# Patient Record
Sex: Male | Born: 1935 | Race: White | Hispanic: No | Smoking: Never smoker
Health system: Southern US, Community
[De-identification: ages and names within clinical notes are randomized; demographics above are authoritative.]

---

## 2019-12-30 ENCOUNTER — Encounter (HOSPITAL_COMMUNITY): Payer: Self-pay | Admitting: Emergency Medicine

## 2019-12-30 ENCOUNTER — Emergency Department (HOSPITAL_COMMUNITY)
Admission: EM | Admit: 2019-12-30 | Discharge: 2019-12-30 | Disposition: A | Discharge status: Home / Self Care | Payer: Medicare Other | Attending: Emergency Medicine | Admitting: Emergency Medicine

## 2019-12-30 ENCOUNTER — Other Ambulatory Visit: Payer: Self-pay

## 2019-12-30 ENCOUNTER — Emergency Department (HOSPITAL_COMMUNITY): Payer: Medicare Other

## 2019-12-30 DIAGNOSIS — W231XXA Caught, crushed, jammed, or pinched between stationary objects, initial encounter: Secondary | ICD-10-CM | POA: Diagnosis not present

## 2019-12-30 DIAGNOSIS — Z23 Encounter for immunization: Secondary | ICD-10-CM | POA: Insufficient documentation

## 2019-12-30 DIAGNOSIS — S62632A Displaced fracture of distal phalanx of right middle finger, initial encounter for closed fracture: Secondary | ICD-10-CM | POA: Diagnosis not present

## 2019-12-30 DIAGNOSIS — S61212A Laceration without foreign body of right middle finger without damage to nail, initial encounter: Secondary | ICD-10-CM | POA: Diagnosis not present

## 2019-12-30 DIAGNOSIS — S6991XA Unspecified injury of right wrist, hand and finger(s), initial encounter: Secondary | ICD-10-CM | POA: Diagnosis present

## 2019-12-30 DIAGNOSIS — S62632B Displaced fracture of distal phalanx of right middle finger, initial encounter for open fracture: Secondary | ICD-10-CM

## 2019-12-30 DIAGNOSIS — S61312A Laceration without foreign body of right middle finger with damage to nail, initial encounter: Secondary | ICD-10-CM

## 2019-12-30 DIAGNOSIS — Y92810 Car as the place of occurrence of the external cause: Secondary | ICD-10-CM | POA: Diagnosis not present

## 2019-12-30 MED ORDER — CEPHALEXIN 250 MG PO CAPS
500.0000 mg | ORAL_CAPSULE | Freq: Once | ORAL | Status: AC
  Administered 2019-12-30: 500 mg via ORAL
  Filled 2019-12-30: qty 2

## 2019-12-30 MED ORDER — ACETAMINOPHEN 325 MG PO TABS
650.0000 mg | ORAL_TABLET | Freq: Once | ORAL | Status: AC
  Administered 2019-12-30: 650 mg via ORAL
  Filled 2019-12-30: qty 2

## 2019-12-30 MED ORDER — TETANUS-DIPHTH-ACELL PERTUSSIS 5-2.5-18.5 LF-MCG/0.5 IM SUSY
0.5000 mL | PREFILLED_SYRINGE | Freq: Once | INTRAMUSCULAR | Status: AC
  Administered 2019-12-30: 0.5 mL via INTRAMUSCULAR
  Filled 2019-12-30: qty 0.5

## 2019-12-30 MED ORDER — LIDOCAINE HCL (PF) 1 % IJ SOLN
5.0000 mL | Freq: Once | INTRAMUSCULAR | Status: AC
  Administered 2019-12-30: 5 mL via INTRADERMAL
  Filled 2019-12-30: qty 5

## 2019-12-30 MED ORDER — CEPHALEXIN 500 MG PO CAPS: 500.0000 mg | ORAL_CAPSULE | Freq: Two times a day (BID) | ORAL | 0 refills | Status: AC

## 2019-12-30 MED ORDER — LIDOCAINE HCL (PF) 1 % IJ SOLN
INTRAMUSCULAR | Status: AC
  Filled 2019-12-30: qty 30

## 2019-12-30 NOTE — Discharge Instructions (Signed)
Take antibiotics as discussed. Keep clean soap and water as needed. Sutures are absorbable See hand surgeon for recheck later this week.  Tylenol for pain and ice as needed every 4 hrs.

## 2019-12-30 NOTE — Progress Notes (Signed)
Orthopedic Tech Progress Note Patient Details:  Frank Smith 02-02-1931 [id number] Was told by MD to apply finger splint Ortho Devices Type of Ortho Device: Finger splint Ortho Device/Splint Interventions: Application, Adjustment   Post Interventions Patient Tolerated: Well Instructions Provided: Care of device   Donald Pore 12/30/2019, 5:53 PM

## 2019-12-30 NOTE — ED Provider Notes (Signed)
[facility] EMERGENCY DEPARTMENT Provider Note   CSN: [id number] Arrival date & time: 12/30/19  1246     History Chief Complaint  Patient presents with  . Finger Injury    Frank Smith is a 86 y.o. male.  Patient presents with right middle finger injury that occurred prior to arrival after his finger got stuck in a car window that was closing on it.  Patient currently not on any blood thinners.  No other injuries.  Pain at the site.  No numbness or weakness appreciated.  Patient's tetanus shot not up-to-date.  No fevers or infectious symptoms.        History reviewed. No pertinent past medical history.  There are no problems to display for this patient.   History reviewed. No pertinent surgical history.     No family history on file.  Social History   Tobacco Use  . Smoking status: Never Smoker  Substance Use Topics  . Alcohol use: Never  . Drug use: Never    Home Medications Prior to Admission medications   Medication Sig Start Date End Date Taking? Authorizing Provider  acetaminophen (TYLENOL) 325 MG tablet Take 325 mg by mouth every 6 (six) hours as needed for headache (pain).   Yes [provider]  Multiple Vitamins-Minerals (ICAPS PO) Take 2 capsules by mouth 2 (two) times daily.   Yes [provider]  polyvinyl alcohol (ARTIFICIAL TEARS) 1.4 % ophthalmic solution Place 1 drop into both eyes 4 (four) times daily.   Yes [provider]  cephALEXin (KEFLEX) 500 MG capsule Take 1 capsule (500 mg total) by mouth 2 (two) times daily for 7 days. 12/30/19 01/06/20  Blane Ohara, MD    Allergies    Patient has no known allergies.  Review of Systems   Review of Systems  Constitutional: Negative for chills and fever.  HENT: Negative for congestion.   Eyes: Negative for visual disturbance.  Respiratory: Negative for shortness of breath.   Cardiovascular: Negative for chest pain.  Gastrointestinal: Negative  for abdominal pain and vomiting.  Genitourinary: Negative for dysuria and flank pain.  Musculoskeletal: Negative for back pain, neck pain and neck stiffness.  Skin: Positive for wound.  Neurological: Negative for weakness, light-headedness, numbness and headaches.    Physical Exam Updated Vital Signs BP (!) 187/77 (BP Location: Left Arm)   Pulse 60   Temp 98.5 F (36.9 C) (Oral)   Resp 20   Ht 5\' 10"  (1.778 m)   Wt 77.1 kg   SpO2 100%   BMI 24.39 kg/m   Physical Exam Vitals and nursing note reviewed.  Constitutional:      Appearance: Normal appearance.  HENT:     Head: Normocephalic.     Mouth/Throat:     Mouth: Mucous membranes are moist.  Eyes:     General:        Right eye: No discharge.        Left eye: No discharge.  Pulmonary:     Effort: Pulmonary effort is normal.  Musculoskeletal:        General: Swelling, tenderness, deformity and signs of injury present.     Cervical back: Normal range of motion.     Comments: Pt has normal strength with F/E of dip and pip right middle finger  Skin:    General: Skin is warm.     Capillary Refill: Capillary refill takes less than 2 seconds.     Comments: See image, complicated laceration distal  right middle finger, primarily dorsal aspect, lacerations approx 2 cm with mild bleeding/ gaping.   Neurological:     General: No focal deficit present.     Mental Status: He is alert.  Psychiatric:        Mood and Affect: Mood normal.         ED Results / Procedures / Treatments   Labs (all labs ordered are listed, but only abnormal results are displayed) Labs Reviewed - No data to display  EKG None  Radiology DG Finger Middle Right  Result Date: 12/30/2019 CLINICAL DATA:  Injury with laceration from car window EXAM: RIGHT MIDDLE FINGER 2+V COMPARISON:  None. FINDINGS: Alignment is anatomic. Displaced fracture of the phalangeal tuft dorsally. 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 IMPRESSION: Small acute and  displaced fracture of the third digit phalangeal tuft dorsally. Electronically Signed   By: Guadlupe Spanish M.D.   On: 12/30/2019 14:26    Procedures .Marland KitchenLaceration Repair  Date/Time: 12/30/2019 6:08 PM Performed by: Blane Ohara, MD Authorized by: Blane Ohara, MD   Consent:    Consent obtained:  Verbal   Consent given by:  Patient   Risks discussed:  Pain, infection, need for additional repair, retained foreign body, tendon damage, poor cosmetic result, nerve damage, poor wound healing and vascular damage Anesthesia (see MAR for exact dosages):    Anesthesia method:  Nerve block   Block location:  Prox middle finger   Block needle gauge:  25 G   Block anesthetic:  Lidocaine 1% w/o epi   Block technique:  Landmark   Block injection procedure:  Anatomic landmarks identified   Block outcome:  Anesthesia achieved Laceration details:    Location:  Finger   Finger location:  R long finger   Length (cm):  2   Depth (mm):  10 Repair type:    Repair type:  Complex Pre-procedure details:    Preparation:  Patient was prepped and draped in usual sterile fashion Exploration:    Limited defect created (wound extended): yes     Hemostasis achieved with:  Direct pressure   Wound exploration: wound explored through full range of motion and entire depth of wound probed and visualized     Wound extent: areolar tissue violated and vascular damage     Wound extent: no foreign bodies/material noted, no muscle damage noted and no tendon damage noted     Contaminated: no   Treatment:    Area cleansed with:  Betadine and saline   Amount of cleaning:  Extensive   Irrigation solution:  Sterile saline   Irrigation volume:  200   Irrigation method:  Syringe   Visualized foreign bodies/material removed: no     Debridement:  Minimal   Undermining:  Minimal   Scar revision: no   Skin repair:    Repair method:  Sutures   Suture size:  5-0   Suture material:  Nylon   Suture technique:  Simple  interrupted   Number of sutures:  10 Approximation:    Approximation:  Close Post-procedure details:    Dressing:  Non-adherent dressing and splint for protection   Patient tolerance of procedure:  Tolerated well, no immediate complications   (including critical care time)  Medications Ordered in ED Medications  lidocaine (PF) (XYLOCAINE) 1 % injection (has no administration in time range)  lidocaine (PF) (XYLOCAINE) 1 % injection 5 mL (5 mLs Intradermal Given by Other 12/30/19 1655)  acetaminophen (TYLENOL) tablet 650 mg (650 mg Oral Given 12/30/19  1654)  Tdap (BOOSTRIX) injection 0.5 mL (0.5 mLs Intramuscular Given 12/30/19 1655)  cephALEXin (KEFLEX) capsule 500 mg (500 mg Oral Given 12/30/19 1801)    ED Course  I have reviewed the triage vital signs and the nursing notes.  Pertinent labs & imaging results that were available during my care of the patient were reviewed by me and considered in my medical decision making (see chart for details).    MDM Rules/Calculators/A&P                          Patient presents with isolated finger laceration and contusion.  X-ray ordered and reviewed showing displaced fracture distally. Pain medicines and tetanus shot ordered. Discussed case with orthopedic hand on-call who recommended suture repair, wound care and follow-up closely in the office. Discussed risks and benefits of laceration repair and patient comfortable with this plan. Complicated finger laceration repair performed, patient tolerated well.  Stressed close follow-up with hand surgeon in the next 48 hours.  Antibiotics given.  Final Clinical Impression(s) / ED Diagnoses Final diagnoses:  Laceration of right middle finger without foreign body with damage to nail, initial encounter  Displaced fracture of distal phalanx of right middle finger, initial encounter for open fracture    Rx / DC Orders ED Discharge Orders         Ordered    cephALEXin (KEFLEX) 500 MG capsule  2  times daily        12/30/19 1806           Blane Ohara, MD 12/30/19 1811

## 2019-12-30 NOTE — ED Triage Notes (Signed)
Onset today right middle finger laceration from a car window closing on it. Bleeding controlled with bandage prior to arrival.

## 2021-12-23 IMAGING — DX DG FINGER MIDDLE 2+V*R*
3 series · 3 of 3 positions shown · non-contrast
Comparison: None.

CLINICAL DATA: Injury with laceration from car window

EXAM:
RIGHT MIDDLE FINGER 2+V

[finger ap]
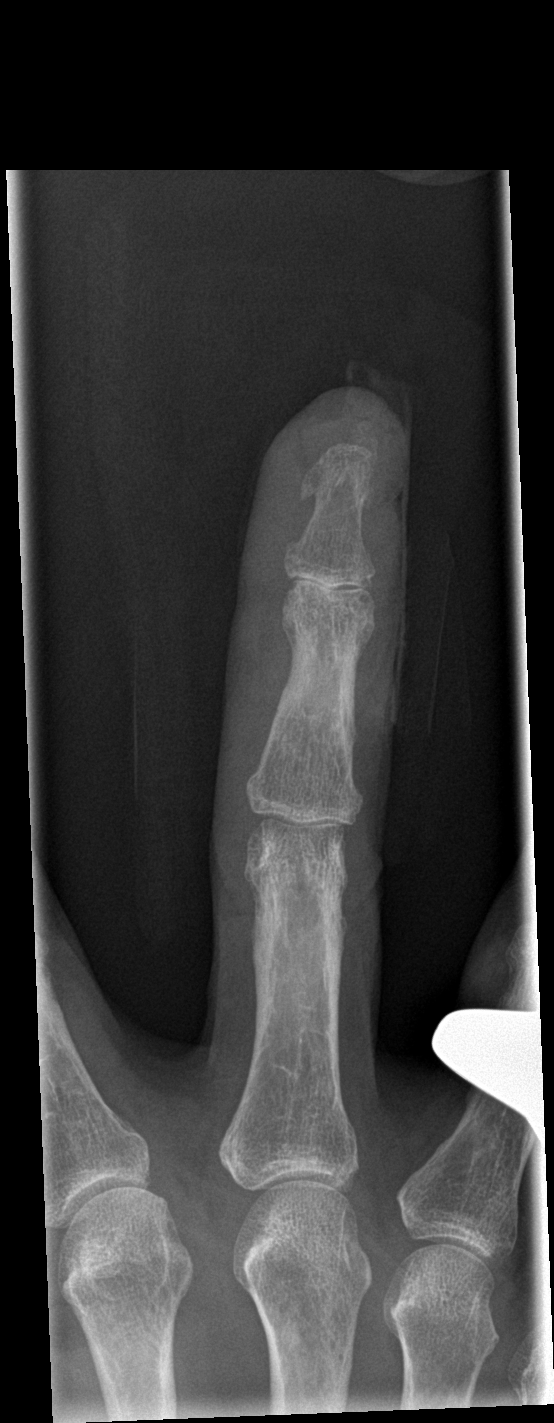

[finger obl]
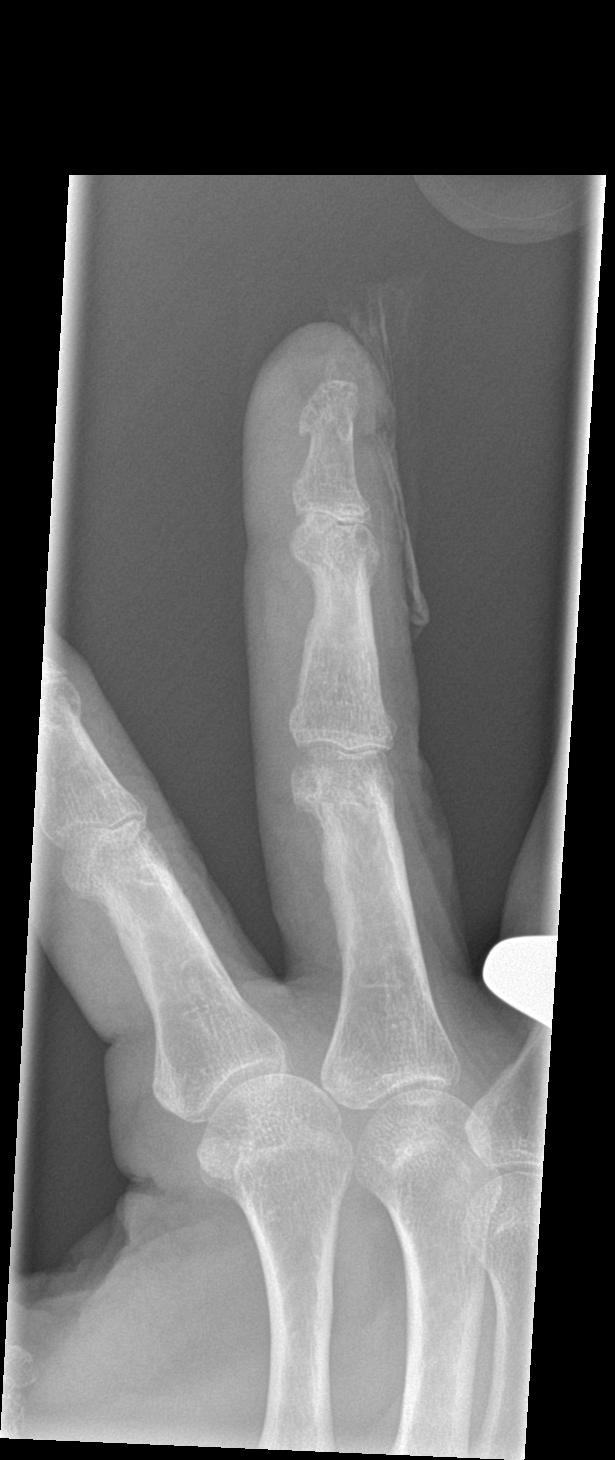

[finger lat]
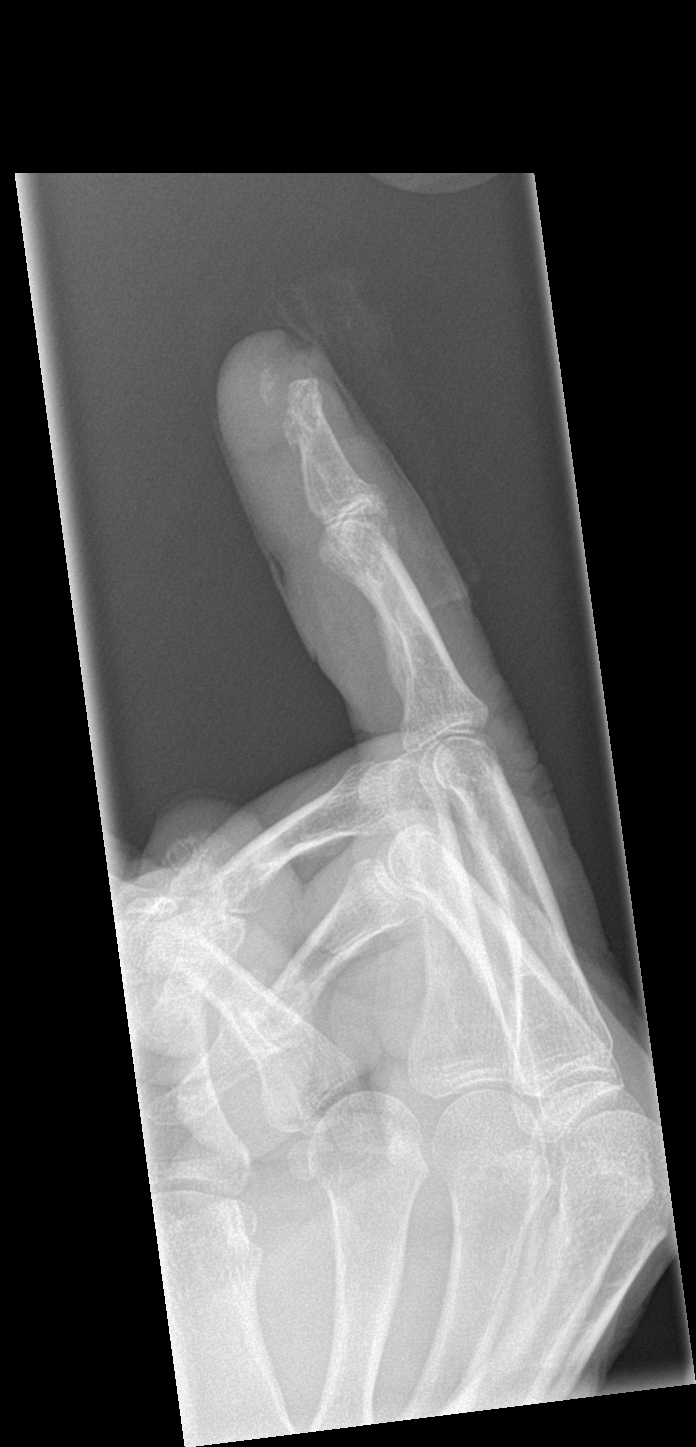

[3 of 3 positions shown; findings below may reference images not displayed]

FINDINGS: Alignment is anatomic. Displaced fracture of the phalangeal tuft
dorsally. Osteoarthritic changes at the PIP joint.
IMPRESSION: Small acute and displaced fracture of the third digit phalangeal
tuft dorsally.
